# Patient Record
Sex: Female | Born: 1990 | Race: White | Hispanic: No | Marital: Single | Smoking: Never smoker
Health system: Southern US, Community
[De-identification: ages and names within clinical notes are randomized; demographics above are authoritative.]

---

## 2017-07-19 ENCOUNTER — Emergency Department: Payer: BLUE CROSS/BLUE SHIELD

## 2017-07-19 ENCOUNTER — Emergency Department
Admission: EM | Admit: 2017-07-19 | Discharge: 2017-07-19 | Disposition: A | Discharge status: Home / Self Care | Payer: BLUE CROSS/BLUE SHIELD | Attending: Emergency Medicine | Admitting: Emergency Medicine

## 2017-07-19 ENCOUNTER — Encounter: Payer: Self-pay | Admitting: Emergency Medicine

## 2017-07-19 DIAGNOSIS — N839 Noninflammatory disorder of ovary, fallopian tube and broad ligament, unspecified: Secondary | ICD-10-CM | POA: Diagnosis not present

## 2017-07-19 DIAGNOSIS — R1032 Left lower quadrant pain: Secondary | ICD-10-CM | POA: Diagnosis present

## 2017-07-19 DIAGNOSIS — R102 Pelvic and perineal pain: Secondary | ICD-10-CM | POA: Insufficient documentation

## 2017-07-19 DIAGNOSIS — N838 Other noninflammatory disorders of ovary, fallopian tube and broad ligament: Secondary | ICD-10-CM

## 2017-07-19 LAB — POCT PREGNANCY, URINE: PREG TEST UR: NEGATIVE

## 2017-07-19 LAB — COMPREHENSIVE METABOLIC PANEL
ALBUMIN: 3.8 g/dL (ref 3.5–5.0)
ALT: 14 U/L (ref 14–54)
ANION GAP: 6 (ref 5–15)
AST: 24 U/L (ref 15–41)
Alkaline Phosphatase: 67 U/L (ref 38–126)
BILIRUBIN TOTAL: 0.9 mg/dL (ref 0.3–1.2)
BUN: 16 mg/dL (ref 6–20)
CHLORIDE: 105 mmol/L (ref 101–111)
CO2: 27 mmol/L (ref 22–32)
Calcium: 8.9 mg/dL (ref 8.9–10.3)
Creatinine, Ser: 0.99 mg/dL (ref 0.44–1.00)
GFR calc Af Amer: >60 mL/min (ref >60)
Glucose, Bld: 95 mg/dL (ref 65–99)
POTASSIUM: 4 mmol/L (ref 3.5–5.1)
Sodium: 138 mmol/L (ref 135–145)
TOTAL PROTEIN: 7 g/dL (ref 6.5–8.1)

## 2017-07-19 LAB — URINALYSIS, COMPLETE (UACMP) WITH MICROSCOPIC
BACTERIA UA: NONE SEEN
BILIRUBIN URINE: NEGATIVE
Glucose, UA: NEGATIVE mg/dL
HGB URINE DIPSTICK: NEGATIVE
KETONES UR: NEGATIVE mg/dL
LEUKOCYTES UA: NEGATIVE
NITRITE: NEGATIVE
PROTEIN: NEGATIVE mg/dL
Specific Gravity, Urine: 1.006 (ref 1.005–1.030)
pH: 5 (ref 5.0–8.0)

## 2017-07-19 LAB — CBC
HEMATOCRIT: 39 % (ref 35.0–47.0)
HEMOGLOBIN: 13.5 g/dL (ref 12.0–16.0)
MCH: 31.8 pg (ref 26.0–34.0)
MCHC: 34.7 g/dL (ref 32.0–36.0)
MCV: 91.4 fL (ref 80.0–100.0)
Platelets: 186 10*3/uL (ref 150–440)
RBC: 4.26 MIL/uL (ref 3.80–5.20)
RDW: 12.2 % (ref 11.5–14.5)
WBC: 9.7 10*3/uL (ref 3.6–11.0)

## 2017-07-19 LAB — LIPASE, BLOOD: LIPASE: 21 U/L (ref 11–51)

## 2017-07-19 MED ORDER — TRAMADOL HCL 50 MG PO TABS
50.0000 mg | ORAL_TABLET | Freq: Once | ORAL | Status: AC
  Administered 2017-07-19: 50 mg via ORAL
  Filled 2017-07-19: qty 1

## 2017-07-19 MED ORDER — TRAMADOL HCL 50 MG PO TABS: 50.0000 mg | ORAL_TABLET | Freq: Four times a day (QID) | ORAL | 0 refills | Status: DC | PRN

## 2017-07-19 NOTE — ED Triage Notes (Signed)
Pt reports lower abdominal pain and pressure for three days. Denies NVD. Denies dysuria. Pt reports history of ovarian cysts. Ambulatory to triage, no apparent distress noted.

## 2017-07-19 NOTE — Discharge Instructions (Signed)
Please seek medical attention for any high fevers, chest pain, shortness of breath, change in behavior, persistent vomiting, bloody stool or any other new or concerning symptoms.  

## 2017-07-19 NOTE — ED Provider Notes (Signed)
[facility] Emergency Department Provider Note   ____________________________________________   I have reviewed the triage vital signs and the nursing notes.   HISTORY  Chief Complaint Abdominal Pain   History limited by: Not Limited   HPI Hayley Golden is a 26 y.o. female who presents to the emergency department today because of abdominal pain.   LOCATION:llq  DURATION:2 days TIMING: gradually worsening SEVERITY: severe at times QUALITY: achy all the time, sharp at times CONTEXT: patient states she has a history of ovarian cyst in the past. Feels somewhat similar to the pain she had with the cyst.  MODIFYING FACTORS: worse with movement ASSOCIATED SYMPTOMS: denies any fevers. No dysuria or bad odor to urine. No change in defecation. No foul smelling vaginal discharge.   Per medical record review no pertinent history.  History reviewed. No pertinent past medical history.  There are no active problems to display for this patient.   No past surgical history on file.  Prior to Admission medications   Not on File    Allergies Patient has no known allergies.  No family history on file.  Social History Social History  Substance Use Topics  . Smoking status: Not on file  . Smokeless tobacco: Not on file  . Alcohol use Not on file    Review of Systems Constitutional: No fever/chills Eyes: No visual changes. ENT: No sore throat. Cardiovascular: Denies chest pain. Respiratory: Denies shortness of breath. Gastrointestinal: Positive for abdominal pain. Genitourinary: Negative for dysuria. Musculoskeletal: Negative for back pain. Skin: Negative for rash. Neurological: Negative for headaches, focal weakness or numbness.  ____________________________________________   PHYSICAL EXAM:  VITAL SIGNS: ED Triage Vitals  Enc Vitals Group     BP 07/19/17 0952 112/71     Pulse Rate 07/19/17 0952 (!) 59     Resp 07/19/17 0952 18   Temp 07/19/17 0952 98.1 F (36.7 C)     Temp Source 07/19/17 0952 Oral     SpO2 07/19/17 0952 99 %     Weight 07/19/17 1012 150 lb (68 kg)     Height 07/19/17 1012 5\' 7"  (1.702 m)     Head Circumference --      Peak Flow --      Pain Score 07/19/17 1012 8   Constitutional: Alert and oriented. Well appearing and in no distress. Eyes: Conjunctivae are normal.  ENT   Head: Normocephalic and atraumatic.   Nose: No congestion/rhinnorhea.   Mouth/Throat: Mucous membranes are moist.   Neck: No stridor. Hematological/Lymphatic/Immunilogical: No cervical lymphadenopathy. Cardiovascular: Normal rate, regular rhythm.  No murmurs, rubs, or gallops. Respiratory: Normal respiratory effort without tachypnea nor retractions. Breath sounds are clear and equal bilaterally. No wheezes/rales/rhonchi. Gastrointestinal: Soft and minimally tender in the left lower quadrant. No rebound. No guarding.  Genitourinary: Deferred Musculoskeletal: Normal range of motion in all extremities. No lower extremity edema. Neurologic:  Normal speech and language. No gross focal neurologic deficits are appreciated.  Skin:  Skin is warm, dry and intact. No rash noted. Psychiatric: Mood and affect are normal. Speech and behavior are normal. Patient exhibits appropriate insight and judgment.  ____________________________________________    LABS (pertinent positives/negatives)  upreg neg Lipase 21 cmp wnl Cbc wnl ua not consistent with infection  ____________________________________________   EKG  None  ____________________________________________    RADIOLOGY  US pelvis Left adnexal mass  ____________________________________________   PROCEDURES  Procedures  ____________________________________________   INITIAL IMPRESSION / ASSESSMENT AND PLAN / ED COURSE  Pertinent labs &  imaging results that were available during my care of the patient were reviewed by me and considered in my  medical decision making (see chart for details).  Differential diagnosis includes, but is not limited to, ovarian cyst, ovarian torsion, acute appendicitis, diverticulitis, urinary tract infection/pyelonephritis, endometriosis, bowel obstruction, colitis, renal colic, gastroenteritis, hernia, pregnancy related pain including ectopic pregnancy, etc.  Patient's workup here without fever or leukocytosis.  Patient denies any concerning vaginal discharge.  CT scan does show a left ovarian mass like structure.  I had a discussion with Dr. Valentino Saxon with OB/GYN about this finding.  At this point she does not feel CT scan is necessary.  I had a discussion with the patient about the findings in person cons of CT scan.  She did feel comfortable deferring CT scan at this time.  The plan will be for patient to follow-up with Dr. Valentino Saxon for repeat ultrasound and evaluation.  I did evaluate the [place] drug database prior to any prescriptions.  _____________________________________   FINAL CLINICAL IMPRESSION(S) / ED DIAGNOSES  Final diagnoses:  Pelvic pain  Ovarian mass, left     Note: This dictation was prepared with Dragon dictation. Any transcriptional errors that result from this process are unintentional     Phineas Semen, MD 07/19/17 [phone]

## 2017-07-19 NOTE — ED Notes (Signed)
Pt c/o LLQ/suprapubic pain for the past 3 days with a hx of ovarian cyst. Denies N/V/D/vaginal bleeding..Marland Kitchen

## 2017-07-19 NOTE — ED Notes (Signed)
Patient transported to Ultrasound 

## 2018-04-16 ENCOUNTER — Other Ambulatory Visit: Payer: Self-pay

## 2018-04-16 ENCOUNTER — Encounter: Payer: Self-pay | Admitting: Emergency Medicine

## 2018-04-16 ENCOUNTER — Emergency Department: Payer: Self-pay

## 2018-04-16 ENCOUNTER — Emergency Department
Admission: EM | Admit: 2018-04-16 | Discharge: 2018-04-16 | Disposition: A | Discharge status: Home / Self Care | Payer: Self-pay | Attending: Emergency Medicine | Admitting: Emergency Medicine

## 2018-04-16 DIAGNOSIS — S2231XA Fracture of one rib, right side, initial encounter for closed fracture: Secondary | ICD-10-CM | POA: Insufficient documentation

## 2018-04-16 DIAGNOSIS — Y998 Other external cause status: Secondary | ICD-10-CM | POA: Insufficient documentation

## 2018-04-16 DIAGNOSIS — W500XXA Accidental hit or strike by another person, initial encounter: Secondary | ICD-10-CM | POA: Insufficient documentation

## 2018-04-16 DIAGNOSIS — Y92003 Bedroom of unspecified non-institutional (private) residence as the place of occurrence of the external cause: Secondary | ICD-10-CM | POA: Insufficient documentation

## 2018-04-16 DIAGNOSIS — Y9372 Activity, wrestling: Secondary | ICD-10-CM | POA: Insufficient documentation

## 2018-04-16 MED ORDER — TRAMADOL HCL 50 MG PO TABS: 50.0000 mg | ORAL_TABLET | Freq: Four times a day (QID) | ORAL | 0 refills | Status: AC | PRN

## 2018-04-16 MED ORDER — TRAMADOL HCL 50 MG PO TABS: 50.0000 mg | ORAL_TABLET | Freq: Four times a day (QID) | ORAL | 0 refills | Status: DC | PRN

## 2018-04-16 MED ORDER — CYCLOBENZAPRINE HCL 10 MG PO TABS: 10.0000 mg | ORAL_TABLET | Freq: Three times a day (TID) | ORAL | 0 refills | Status: AC | PRN

## 2018-04-16 MED ORDER — MELOXICAM 15 MG PO TABS: 15.0000 mg | ORAL_TABLET | Freq: Every day | ORAL | 2 refills | Status: AC

## 2018-04-16 MED ORDER — CYCLOBENZAPRINE HCL 10 MG PO TABS: 10.0000 mg | ORAL_TABLET | Freq: Three times a day (TID) | ORAL | 0 refills | Status: DC | PRN

## 2018-04-16 NOTE — Discharge Instructions (Addendum)
Follow-up with your regular doctor or [facility] acute care if you are not better in 5 to 7 days.  Apply ice to the right ribs.  Take Flexeril 10 mg, cut the medication in half and take this 3 times a day.  Meloxicam 15 mg once a day.  Tramadol for pain not controlled by the Flexeril and meloxicam.  Still continue to take deep breaths as if you do not you will end up with pneumonia.  Return to emergency department if worsening

## 2018-04-16 NOTE — ED Provider Notes (Signed)
[facility] Emergency Department Provider Note  ____________________________________________   First MD Initiated Contact with Patient 04/16/18 [phone]     (approximate)  I have reviewed the triage vital signs and the nursing notes.   HISTORY  Chief Complaint Rib Pain    HPI Hayley Golden is a 27 y.o. female presents emergency department complaining of right-sided rib pain.  She states that she was wrestling with her boyfriend and he went to tackle her on the bed her arm was underneath her and he landed on top of her.  They have heard a loud pop and she had immediate pain on the right side.  She denies any chest pain or shortness of breath.    History reviewed. No pertinent past medical history.  There are no active problems to display for this patient.   History reviewed. No pertinent surgical history.  Prior to Admission medications   Medication Sig Start Date End Date Taking? Authorizing Provider  cyclobenzaprine (FLEXERIL) 10 MG tablet Take 1 tablet (10 mg total) by mouth 3 (three) times daily as needed. 04/16/18   Dejuan Elman, Roselyn BeringSusan W, PA-C  meloxicam (MOBIC) 15 MG tablet Take 1 tablet (15 mg total) by mouth daily. 04/16/18 04/16/19  Kerry Odonohue, Roselyn BeringSusan W, PA-C  traMADol (ULTRAM) 50 MG tablet Take 1 tablet (50 mg total) by mouth every 6 (six) hours as needed. 04/16/18   Faythe GheeFisher, Thersea Manfredonia W, PA-C    Allergies Clindamycin/lincomycin  No family history on file.  Social History Social History   Tobacco Use  . Smoking status: Never Smoker  . Smokeless tobacco: Never Used  Substance Use Topics  . Alcohol use: Yes    Alcohol/week: 1.2 oz    Types: 2 Glasses of wine per week  . Drug use: Never    Review of Systems  Constitutional: No fever/chills Eyes: No visual changes. ENT: No sore throat. Respiratory: Denies cough Genitourinary: Negative for dysuria. Musculoskeletal: Negative for back pain.  Positive for right rib pain Skin: Negative for  rash.    ____________________________________________   PHYSICAL EXAM:  VITAL SIGNS: ED Triage Vitals  Enc Vitals Group     BP 04/16/18 0842 122/74     Pulse Rate 04/16/18 0842 70     Resp 04/16/18 0842 16     Temp 04/16/18 0842 97.8 F (36.6 C)     Temp Source 04/16/18 0842 Oral     SpO2 04/16/18 0842 100 %     Weight 04/16/18 0843 152 lb (68.9 kg)     Height 04/16/18 0843 5\' 7"  (1.702 m)     Head Circumference --      Peak Flow --      Pain Score 04/16/18 0843 9     Pain Loc --      Pain Edu? --      Excl. in GC? --     Constitutional: Alert and oriented. Well appearing and in no acute distress. Eyes: Conjunctivae are normal.  Head: Atraumatic. Nose: No congestion/rhinnorhea. Mouth/Throat: Mucous membranes are moist.   Neck:  supple no lymphadenopathy noted Cardiovascular: Normal rate, regular rhythm. Heart sounds are normal Respiratory: Normal respiratory effort.  No retractions, lungs c t a, the right ribs are tender to palpation Abd: soft nontender bs normal all 4 quad GU: deferred Musculoskeletal: FROM all extremities, warm and well perfused Neurologic:  Normal speech and language.  Skin:  Skin is warm, dry and intact. No rash noted. Psychiatric: Mood and affect are normal. Speech and behavior are  normal.  ____________________________________________   LABS (all labs ordered are listed, but only abnormal results are displayed)  Labs Reviewed - No data to display ____________________________________________   ____________________________________________  RADIOLOGY  X-ray of the right ribs is positive for 12th rib fracture nondisplaced  ____________________________________________   PROCEDURES  Procedure(s) performed: No  Procedures    ____________________________________________   INITIAL IMPRESSION / ASSESSMENT AND PLAN / ED COURSE  Pertinent labs & imaging results that were available during my care of the patient were reviewed by me  and considered in my medical decision making (see chart for details).   Patient is 27 year old female presents emergency department complaining of right rib pain.  She states that she was wrestling with her boyfriend and he tackled her with her arm underneath her.  She felt a pop in the right rib.  Physical exam patient is tender along the right rib cage.  Abdomen is nontender.  X-ray of the right ribs is positive for 1/12 rib fracture which is nondisplaced.  Discussed the x-ray results with patient.  She was given a prescription for Flexeril, meloxicam, and tramadol.  These were E scripted to [facility].  She is to apply ice to the area.  And she becomes short of breath or has chest pain she should return the emergency department.  She states she understands will comply with our instructions.  She was discharged in stable condition     As part of my medical decision making, I reviewed the following data within the electronic MEDICAL RECORD NUMBER Nursing notes reviewed and incorporated, Old chart reviewed, Radiograph reviewed x-ray of the right ribs is possible 12th rib fracture, Notes from prior ED visits and [place] Controlled Substance Database  ____________________________________________   FINAL CLINICAL IMPRESSION(S) / ED DIAGNOSES  Final diagnoses:  Closed fracture of one rib of right side, initial encounter      NEW MEDICATIONS STARTED DURING THIS VISIT:  Discharge Medication List as of 04/16/2018  9:40 AM    START taking these medications   Details  meloxicam (MOBIC) 15 MG tablet Take 1 tablet (15 mg total) by mouth daily., Starting Tue 04/16/2018, Until Wed 04/16/2019, Normal    cyclobenzaprine (FLEXERIL) 10 MG tablet Take 1 tablet (10 mg total) by mouth 3 (three) times daily as needed., Starting Tue 04/16/2018, Normal         Note:  This document was prepared using Dragon voice recognition software and may include unintentional dictation errors.    Faythe Ghee,  PA-C 04/16/18 1406    Schaevitz, Myra Rude, MD 04/16/18 [phone]

## 2018-04-16 NOTE — ED Notes (Signed)
See triage note  Presents with pain to right lateral rib area  States she was horsing around   She fell   States she had her left elbow bent under her and he landed on her right side

## 2018-04-16 NOTE — ED Triage Notes (Signed)
Pt arrives via POV. Pt was wrestling with significant other. Fell onto a hard surface with left elbow bent up against body. Significant other approx 220lb landed on right side. Felt crack on right side at that time.  Pt was awakened in the middle of the night with pain. Reports pain with deep inspiration. Denies chest pain. No shortness of breath. States she is not taking deep breaths d/t the pain and is able to take deep breath still.   Tender to touch under right breast. Pain more on right side and up to right armpit.

## 2020-01-11 IMAGING — CR DG RIBS W/ CHEST 3+V*R*
1 series · 3 of 3 positions shown · non-contrast
Comparison: None.

CLINICAL DATA: Fall with right rib injury and pain. Initial
encounter.

EXAM:
RIGHT RIBS AND CHEST - 3+ VIEW

[Series 1: dg ribs unilateral w/chest right · 0.14mm/px · 3 of 3 slices shown]
[im 1/3]
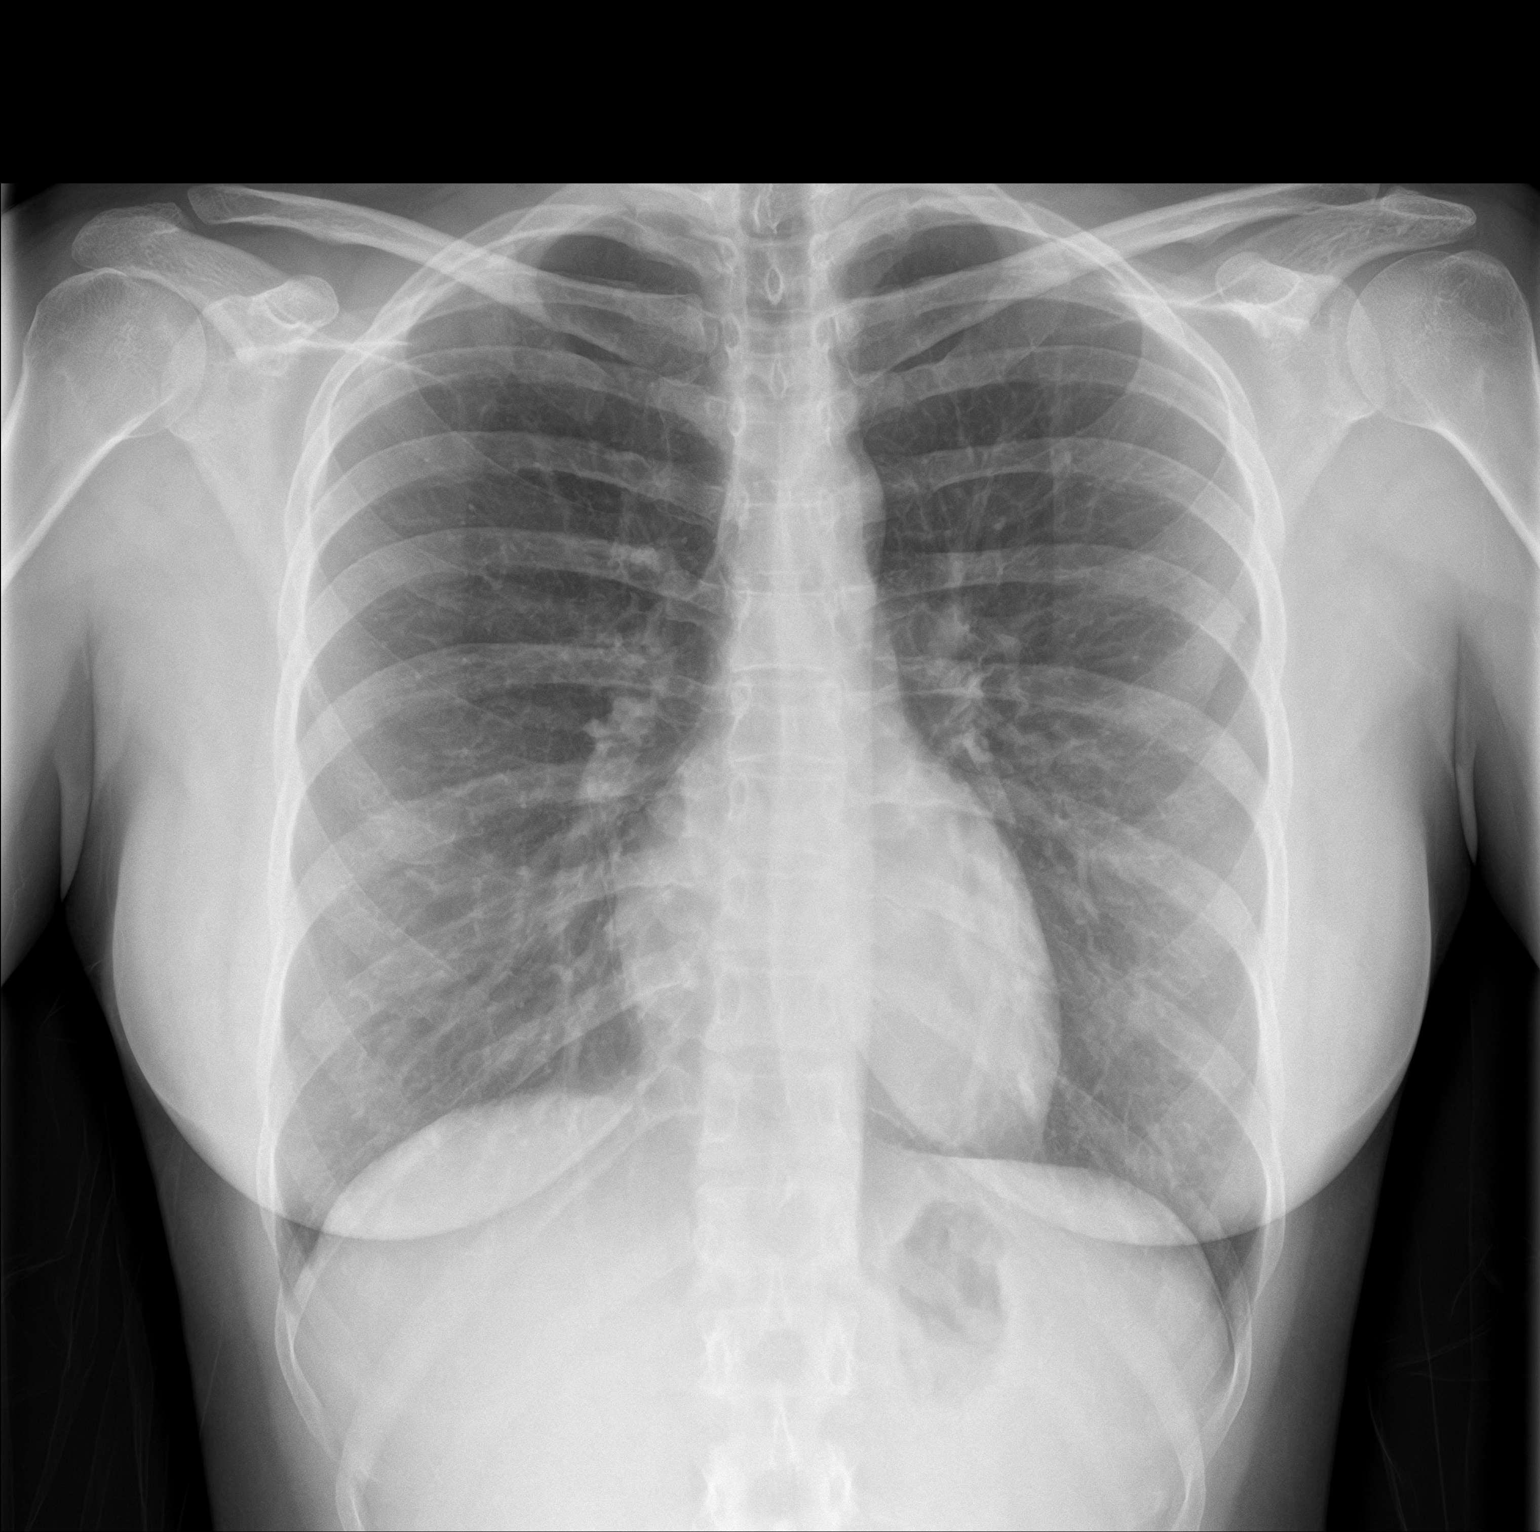
[im 2/3]
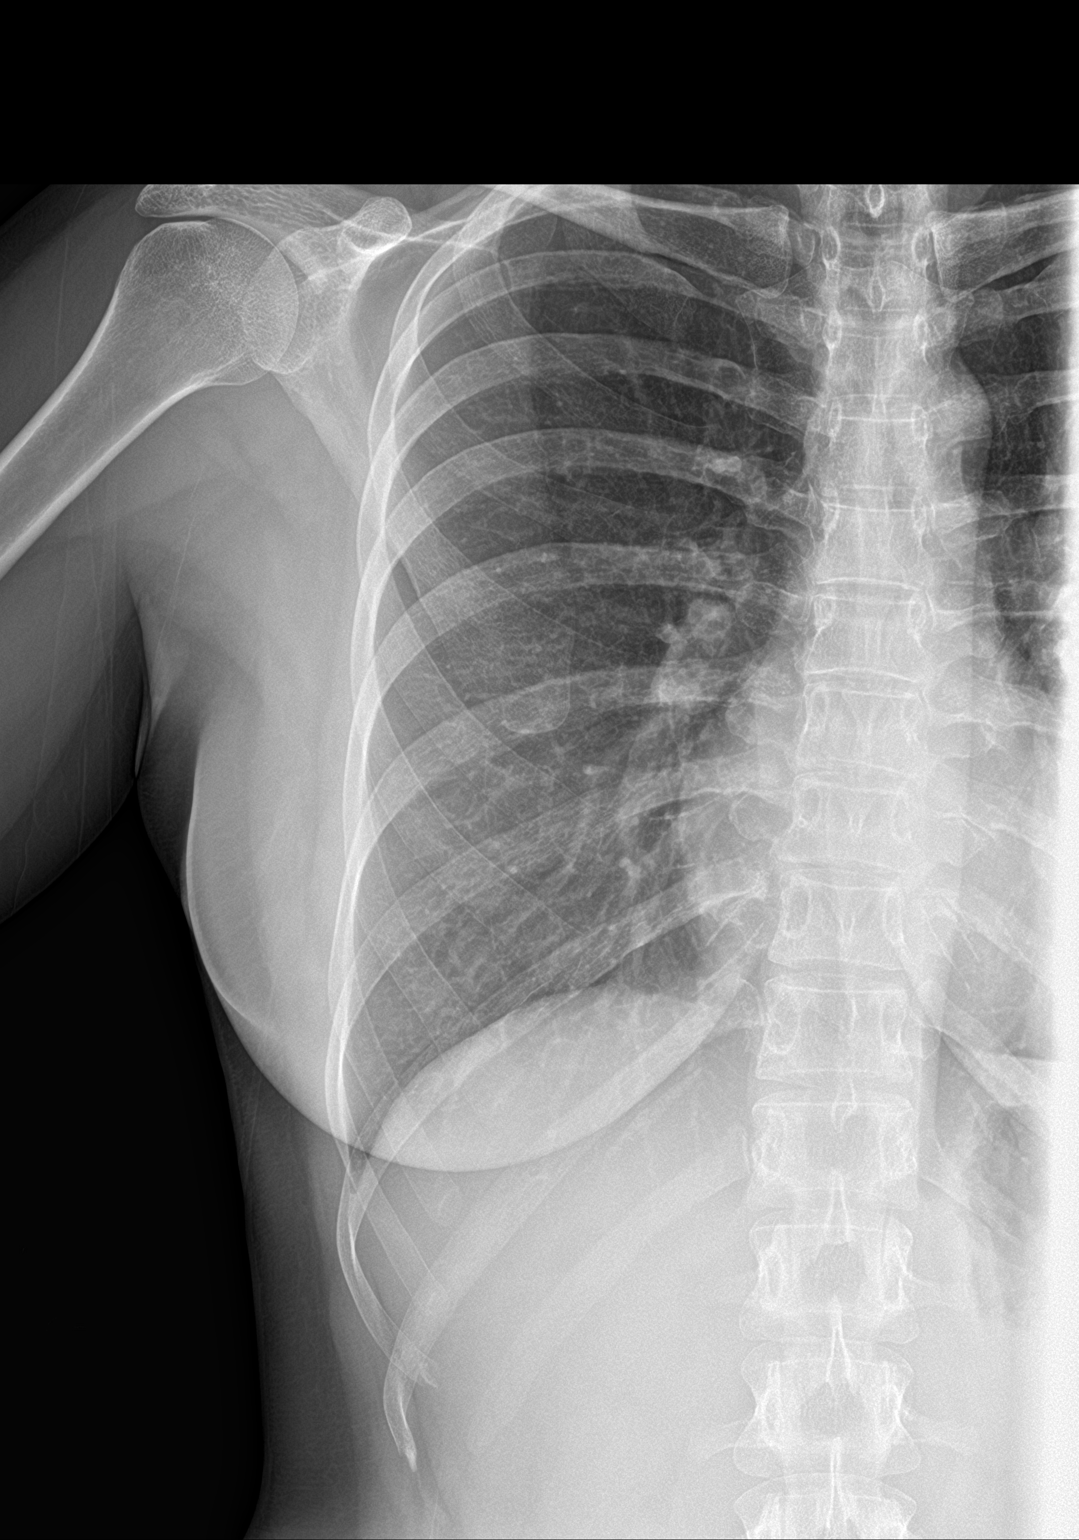
[im 3/3]
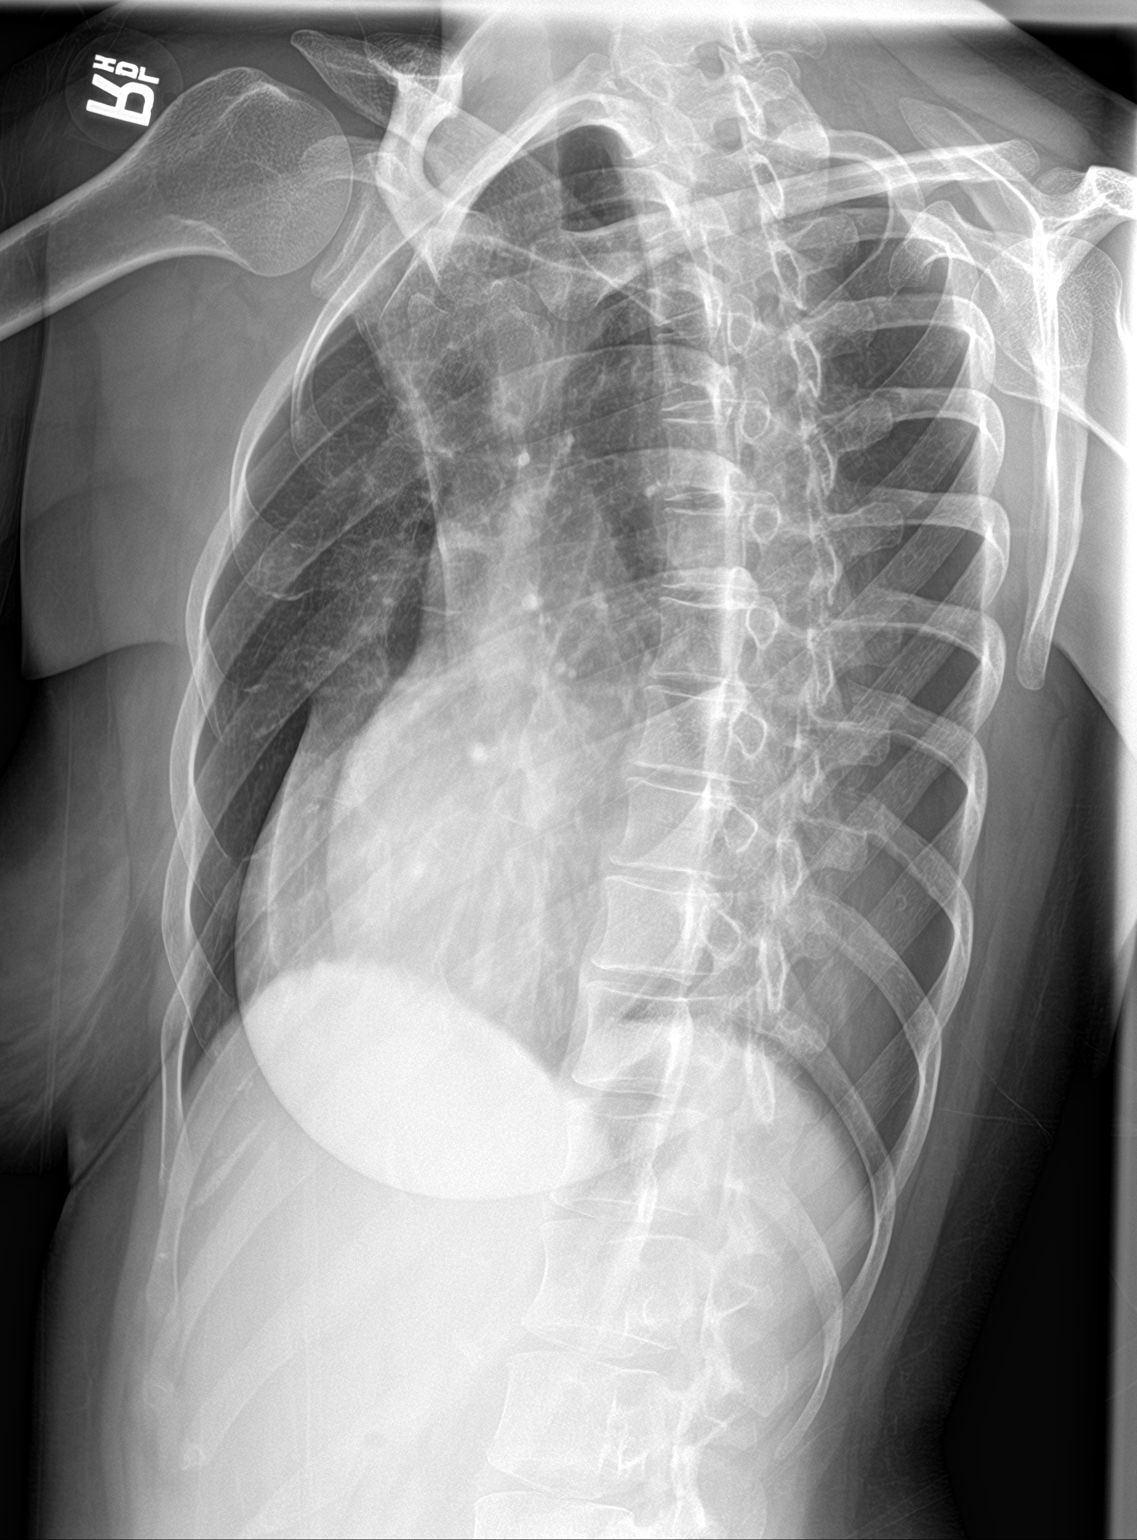

[3 of 3 positions shown; findings below may reference images not displayed]

FINDINGS: Subtle lucency overlapping the distal right twelfth rib on the
oblique view. No hemothorax, pneumothorax, or lung contusion. Normal
heart size and mediastinal contours.
IMPRESSION: 1. Artifact versus nondisplaced fracture at the distal right twelfth
rib, please correlate for focal tenderness.
2. No evidence of intrathoracic injury.
